# Patient Record
Sex: Female | Born: 1965 | Race: Black or African American | Hispanic: No | Marital: Married | Smoking: Current some day smoker
Health system: Southern US, Community
[De-identification: ages and names within clinical notes are randomized; demographics above are authoritative.]

## PROBLEM LIST (undated history)

## (undated) DIAGNOSIS — M549 Dorsalgia, unspecified: Secondary | ICD-10-CM

## (undated) DIAGNOSIS — Z780 Asymptomatic menopausal state: Secondary | ICD-10-CM

---

## 1999-02-11 ENCOUNTER — Inpatient Hospital Stay (HOSPITAL_COMMUNITY): Admission: AD | Admit: 1999-02-11 | Discharge: 1999-02-14 | Payer: Self-pay | Admitting: *Deleted

## 2000-03-19 ENCOUNTER — Other Ambulatory Visit: Admission: RE | Admit: 2000-03-19 | Discharge: 2000-03-19 | Payer: Self-pay | Admitting: *Deleted

## 2001-04-09 ENCOUNTER — Other Ambulatory Visit: Admission: RE | Admit: 2001-04-09 | Discharge: 2001-04-09 | Payer: Self-pay | Admitting: *Deleted

## 2002-02-25 ENCOUNTER — Encounter: Admission: RE | Admit: 2002-02-25 | Discharge: 2002-02-25 | Payer: Self-pay | Admitting: Internal Medicine

## 2002-02-25 ENCOUNTER — Encounter: Payer: Self-pay | Admitting: Internal Medicine

## 2002-05-02 ENCOUNTER — Other Ambulatory Visit: Admission: RE | Admit: 2002-05-02 | Discharge: 2002-05-02 | Payer: Self-pay | Admitting: *Deleted

## 2003-06-05 ENCOUNTER — Other Ambulatory Visit: Admission: RE | Admit: 2003-06-05 | Discharge: 2003-06-05 | Payer: Self-pay | Admitting: *Deleted

## 2004-07-20 ENCOUNTER — Other Ambulatory Visit: Admission: RE | Admit: 2004-07-20 | Discharge: 2004-07-20 | Payer: Self-pay | Admitting: Obstetrics and Gynecology

## 2004-12-30 ENCOUNTER — Other Ambulatory Visit: Admission: RE | Admit: 2004-12-30 | Discharge: 2004-12-30 | Payer: Self-pay | Admitting: Obstetrics and Gynecology

## 2005-03-09 ENCOUNTER — Encounter: Admission: RE | Admit: 2005-03-09 | Discharge: 2005-03-09 | Payer: Self-pay | Admitting: Obstetrics and Gynecology

## 2007-11-01 ENCOUNTER — Emergency Department (HOSPITAL_BASED_OUTPATIENT_CLINIC_OR_DEPARTMENT_OTHER): Admission: EM | Admit: 2007-11-01 | Discharge: 2007-11-01 | Payer: Self-pay | Admitting: Emergency Medicine

## 2010-03-10 ENCOUNTER — Encounter: Admission: RE | Admit: 2010-03-10 | Discharge: 2010-03-10 | Payer: Self-pay | Admitting: Obstetrics and Gynecology

## 2011-11-02 IMAGING — US US BREAST*L*
1 series · 7 of 7 positions shown · non-contrast
Comparison: Previous examinations, including the screening
mammogram dated 02/28/2010.

CLINICAL DATA: Possible left breast mass at recent screening
mammography.

DIGITAL DIAGNOSTIC LEFT MAMMOGRAM  AND LEFT BREAST ULTRASOUND:

[Series 1: us breast*left* · 7 of 7 slices shown]
[im 1/7]
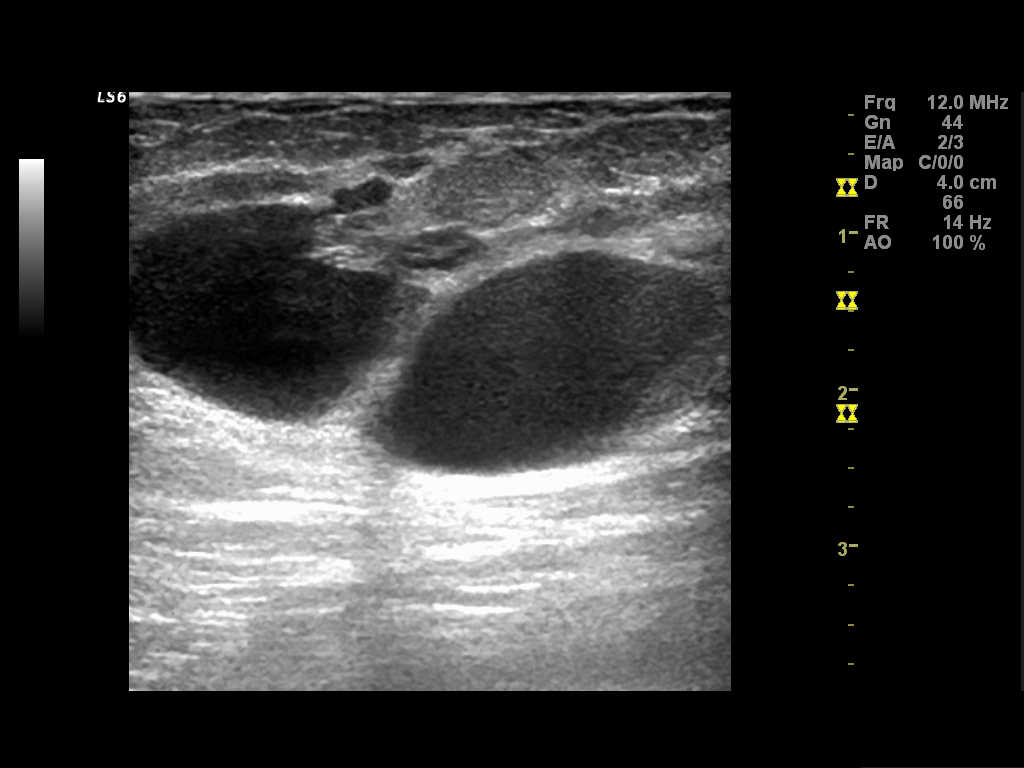
[im 2/7]
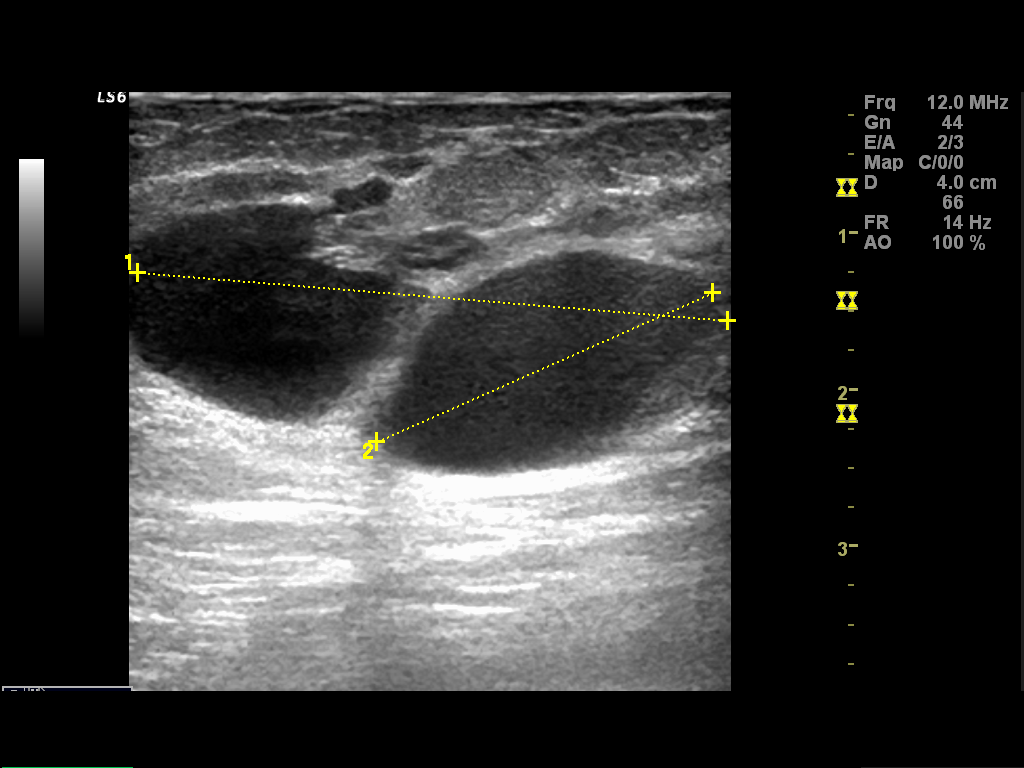
[im 3/7]
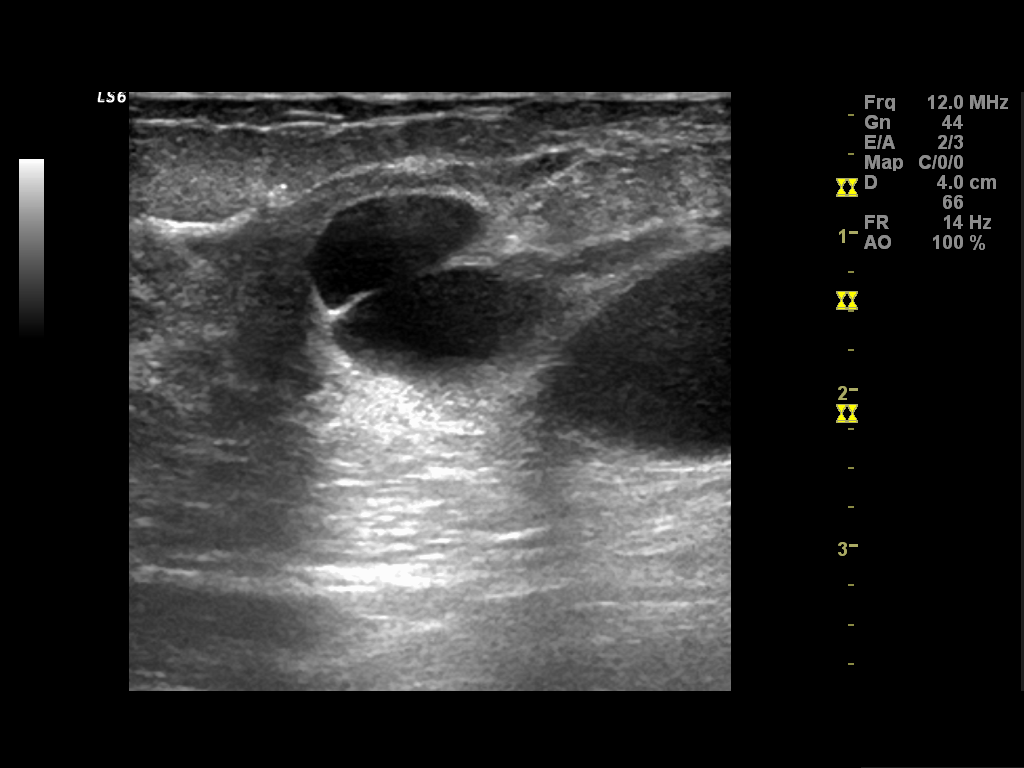
[im 4/7]
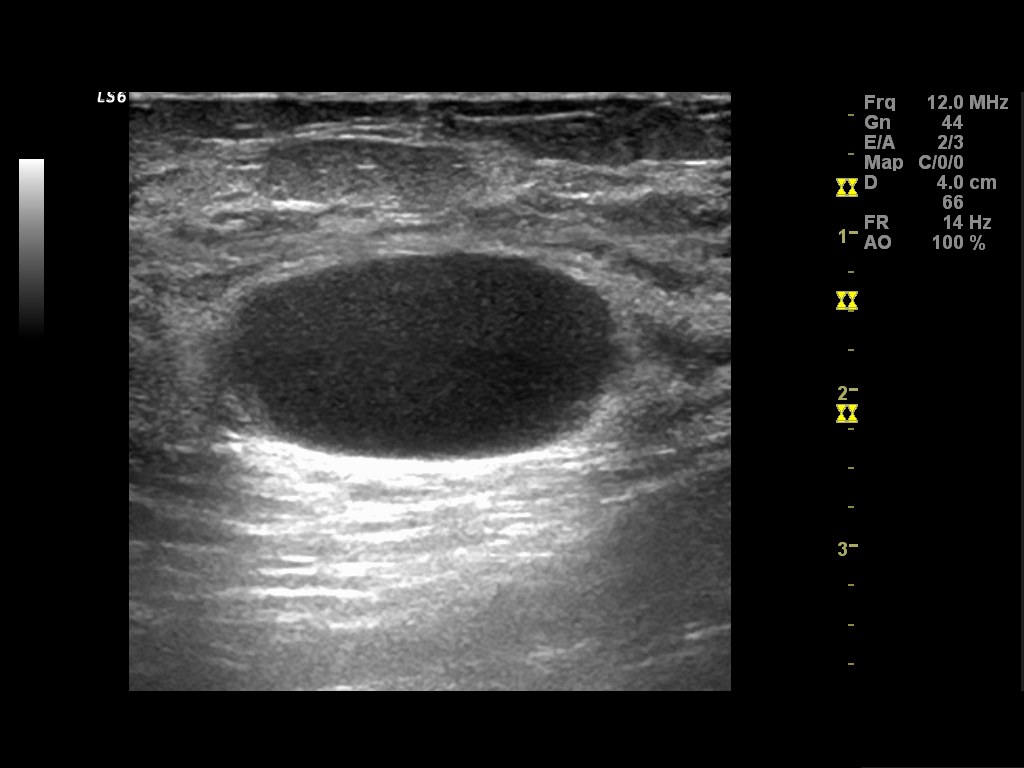
[im 5/7]
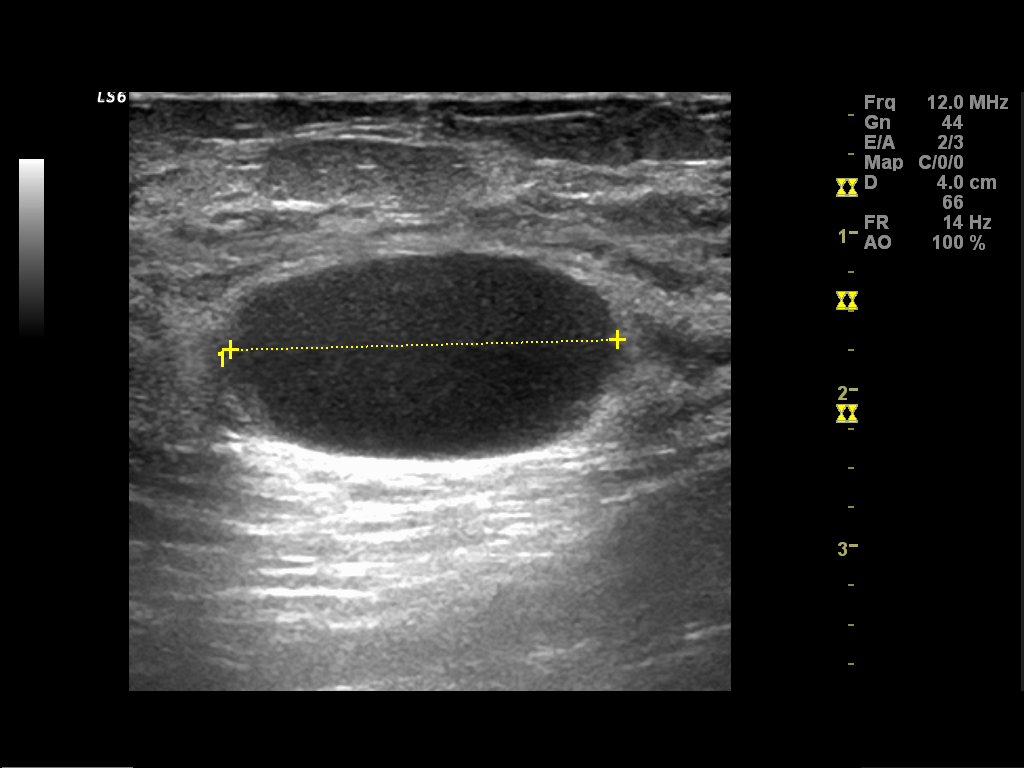
[im 6/7]
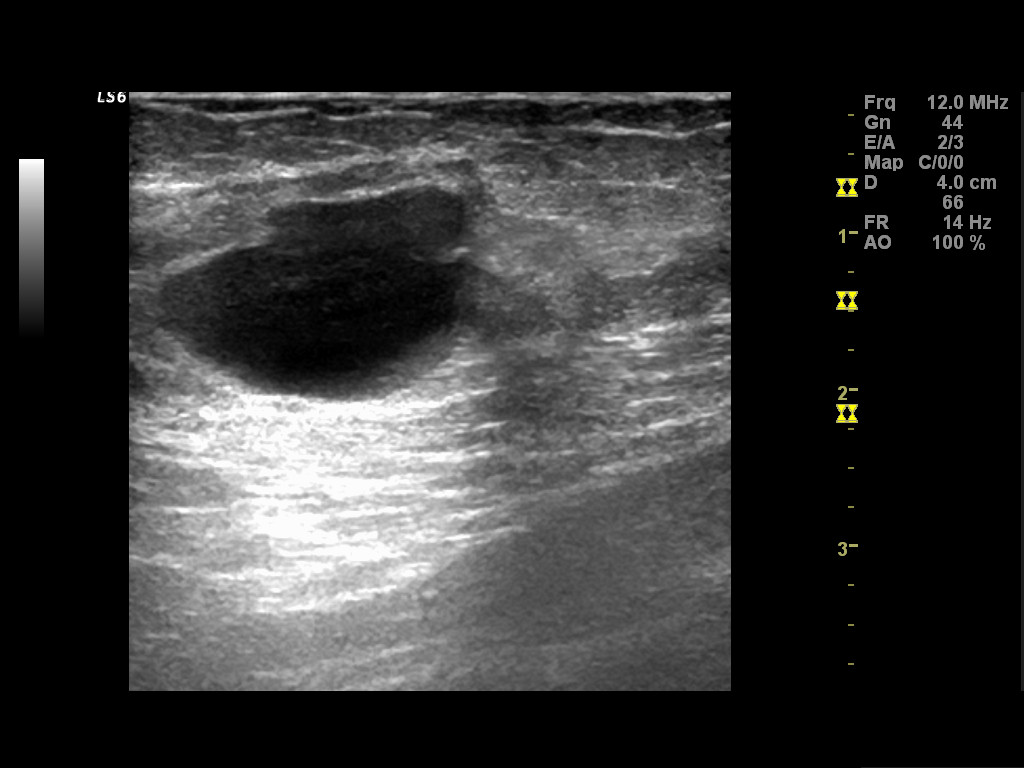
[im 7/7]
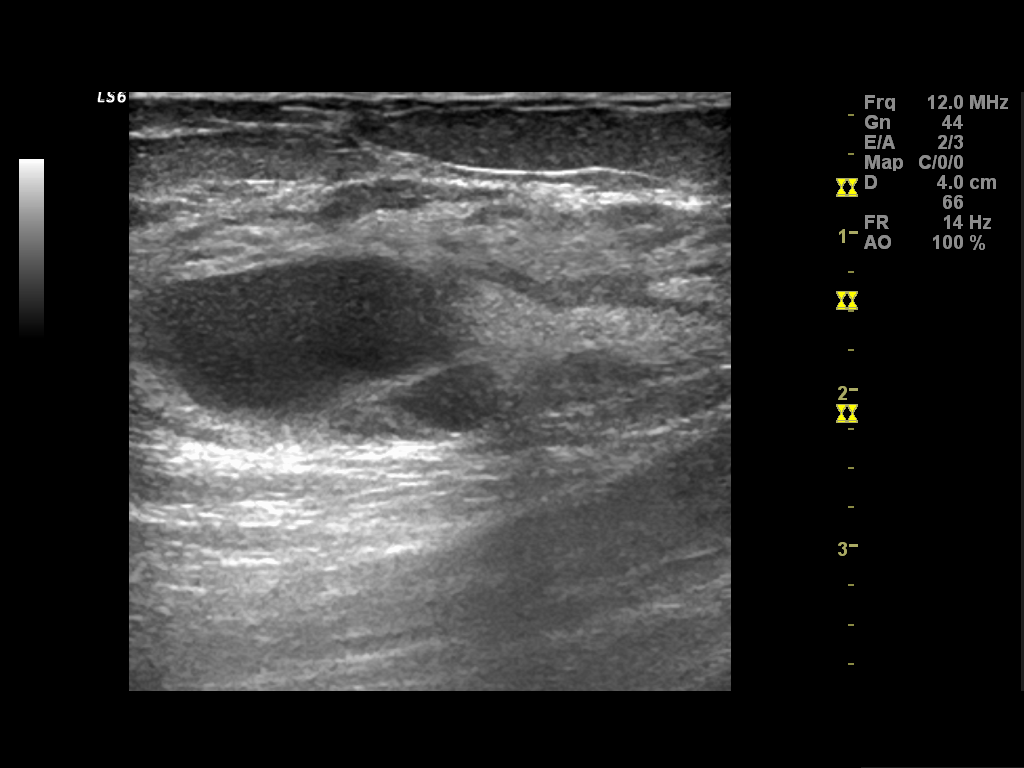

[7 of 7 positions shown; findings below may reference images not displayed]

FINDINGS: Spot compression views of the right breast confirm a
rounded, partially obscured mass in the upper outer left breast.
The visualized margins are smooth.

On physical exam, no mass is palpable in the upper outer left
breast.

Ultrasound is performed, showing a cluster of cysts in the 1:30
o'clock position of the left breast, 5 cm from the nipple.  The
largest cyst measures 2.5 cm in maximum diameter and the cluster
measures 3.8 cm in maximum diameter.  No solid masses or other
findings suspicious for malignancy seen.
IMPRESSION: Left breast cysts.  No evidence of malignancy.  Screening
mammography is recommended in 1 year.

BI-RADS CATEGORY 2:  Benign finding(s).

## 2017-04-02 ENCOUNTER — Emergency Department (HOSPITAL_BASED_OUTPATIENT_CLINIC_OR_DEPARTMENT_OTHER)
Admission: EM | Admit: 2017-04-02 | Discharge: 2017-04-02 | Disposition: A | Discharge status: Home / Self Care | Payer: BLUE CROSS/BLUE SHIELD | Attending: Emergency Medicine | Admitting: Emergency Medicine

## 2017-04-02 ENCOUNTER — Encounter (HOSPITAL_BASED_OUTPATIENT_CLINIC_OR_DEPARTMENT_OTHER): Payer: Self-pay | Admitting: *Deleted

## 2017-04-02 ENCOUNTER — Other Ambulatory Visit: Payer: Self-pay

## 2017-04-02 DIAGNOSIS — M533 Sacrococcygeal disorders, not elsewhere classified: Secondary | ICD-10-CM | POA: Insufficient documentation

## 2017-04-02 DIAGNOSIS — F17200 Nicotine dependence, unspecified, uncomplicated: Secondary | ICD-10-CM | POA: Insufficient documentation

## 2017-04-02 DIAGNOSIS — Z79899 Other long term (current) drug therapy: Secondary | ICD-10-CM | POA: Diagnosis not present

## 2017-04-02 DIAGNOSIS — R1031 Right lower quadrant pain: Secondary | ICD-10-CM | POA: Diagnosis present

## 2017-04-02 HISTORY — DX: Asymptomatic menopausal state: Z78.0

## 2017-04-02 LAB — URINALYSIS, MICROSCOPIC (REFLEX)

## 2017-04-02 LAB — URINALYSIS, ROUTINE W REFLEX MICROSCOPIC
Bilirubin Urine: NEGATIVE
Glucose, UA: NEGATIVE mg/dL
Ketones, ur: NEGATIVE mg/dL
Leukocytes, UA: NEGATIVE
Nitrite: NEGATIVE
Protein, ur: NEGATIVE mg/dL
Specific Gravity, Urine: <1.005 — ABNORMAL LOW (ref 1.005–1.030)
pH: 6.5 (ref 5.0–8.0)

## 2017-04-02 MED ORDER — KETOROLAC TROMETHAMINE 30 MG/ML IJ SOLN
INTRAMUSCULAR | Status: AC
  Filled 2017-04-02: qty 1

## 2017-04-02 MED ORDER — DEXAMETHASONE SODIUM PHOSPHATE 10 MG/ML IJ SOLN
INTRAMUSCULAR | Status: AC
  Filled 2017-04-02: qty 1

## 2017-04-02 MED ORDER — DEXAMETHASONE SODIUM PHOSPHATE 10 MG/ML IJ SOLN
10.0000 mg | Freq: Once | INTRAMUSCULAR | Status: AC
  Administered 2017-04-02: 10 mg via INTRAMUSCULAR

## 2017-04-02 MED ORDER — KETOROLAC TROMETHAMINE 30 MG/ML IJ SOLN
30.0000 mg | Freq: Once | INTRAMUSCULAR | Status: AC
  Administered 2017-04-02: 30 mg via INTRAMUSCULAR

## 2017-04-02 NOTE — ED Notes (Signed)
Pt was triaged by this RN. See note.

## 2017-04-02 NOTE — ED Provider Notes (Signed)
[facility] Provider Note: Erica DellJ. Lane Shadiamond Koska, MD, FACEP  CSN: [id number] MRN: [id number] ARRIVAL: 04/02/17 at 0204 ROOM: [facility]01/[facility]01   CHIEF COMPLAINT  Back Pain   HISTORY OF PRESENT ILLNESS  04/02/17 2:50 AM Erica Mayo is a 51 y.o. female recently placed on ciprofloxacin for 7 days for UTI (confirmed by culture) after having gross hematuria.  She was also having pain in her right SI joint radiating to her right groin. She was given injections of Toradol and dexamethasone which helped the pain but the pain returned yesterday.  She rates the pain as a 10 out of 10, worse with movement or palpation.  She had an ultrasound of her right kidney recently but has not yet gotten the results of that ultrasound. She is no longer having hematuria.  She has applied Icy Hot patches to her right SI joint and hip without relief.   Past Medical History:  Diagnosis Date  . Menopause     History reviewed. No pertinent surgical history.  No family history on file.  Social History   Tobacco Use  . Smoking status: Current Some Day Smoker  . Smokeless tobacco: Never Used  Substance Use Topics  . Alcohol use: No    Frequency: Never  . Drug use: No    Prior to Admission medications   Medication Sig Start Date End Date Taking? Authorizing Provider  ciprofloxacin (CIPRO) 500 MG tablet Take 500 mg by mouth 2 (two) times daily.   Yes [provider]  omeprazole (PRILOSEC) 40 MG capsule Take 40 mg by mouth daily.   Yes [provider]  predniSONE (DELTASONE) 10 MG tablet Take 10 mg by mouth daily with breakfast.   Yes [provider]    Allergies Patient has no known allergies.   REVIEW OF SYSTEMS  Negative except as noted here or in the History of Present Illness.   PHYSICAL EXAMINATION  Initial Vital Signs Blood pressure (!) 147/94, pulse 86, temperature 97.6 F (36.4 C), temperature source Oral, resp. rate 20, height 5\' 2"  (1.575 m), weight  75.8 kg (167 lb), SpO2 98 %.  Examination General: Well-developed, well-nourished female in no acute distress; appearance consistent with age of record HENT: normocephalic; atraumatic Eyes: Normal appearance Neck: supple Heart: regular rate and rhythm Lungs: clear to auscultation bilaterally Abdomen: soft; nondistended; nontender; bowel sounds present Back: Right SI joint tenderness GU: No CVA tenderness Extremities: No deformity; full range of motion Neurologic: Awake, alert and oriented; motor function intact in all extremities and symmetric; no facial droop Skin: Warm and dry Psychiatric: Circumstantial; pressured speech   RESULTS  Summary of this visit's results, reviewed by myself:   EKG Interpretation  Date/Time:    Ventricular Rate:    PR Interval:    QRS Duration:   QT Interval:    QTC Calculation:   R Axis:     Text Interpretation:        Laboratory Studies: Results for orders placed or performed during the hospital encounter of 04/02/17 (from the past 24 hour(s))  Urinalysis, Routine w reflex microscopic     Status: Abnormal   Collection Time: 04/02/17  2:30 AM  Result Value Ref Range   Color, Urine YELLOW YELLOW   APPearance CLEAR CLEAR   Specific Gravity, Urine <1.005 (L) 1.005 - 1.030   pH 6.5 5.0 - 8.0   Glucose, UA NEGATIVE NEGATIVE mg/dL   Hgb urine dipstick SMALL (A) NEGATIVE   Bilirubin Urine NEGATIVE NEGATIVE   Ketones,  ur NEGATIVE NEGATIVE mg/dL   Protein, ur NEGATIVE NEGATIVE mg/dL   Nitrite NEGATIVE NEGATIVE   Leukocytes, UA NEGATIVE NEGATIVE  Urinalysis, Microscopic (reflex)     Status: Abnormal   Collection Time: 04/02/17  2:30 AM  Result Value Ref Range   RBC / HPF 0-5 0 - 5 RBC/hpf   WBC, UA 0-5 0 - 5 WBC/hpf   Bacteria, UA RARE (A) NONE SEEN   Squamous Epithelial / LPF 0-5 (A) NONE SEEN   Imaging Studies: No results found.  ED COURSE  Nursing notes and initial vitals signs, including pulse oximetry, reviewed.  Vitals:    04/02/17 0236 04/02/17 0237  BP: (!) 147/94   Pulse: 86   Resp: 20   Temp: 97.6 F (36.4 C)   TempSrc: Oral   SpO2: 98%   Weight:  75.8 kg (167 lb)  Height:  5\' 2"  (1.575 m)   The patient is requesting injections of Toradol and dexamethasone.  She is declining prescription analgesics.  PROCEDURES    ED DIAGNOSES     ICD-10-CM   1. Sacroiliac joint pain M53.3        Erica Fraley, MD 04/02/17 [phone]

## 2017-04-02 NOTE — ED Notes (Signed)
ED Provider at bedside. 

## 2017-04-02 NOTE — ED Triage Notes (Signed)
Pt states she started noticing blood in her urine 2 weeks ago. Sent for culture. Placed on Cipro for 7 days thus far and Prednisone. Also c/o right side pain at that time. Given Toradol and Dexamethasone injection which helped the pain in her side and leg, but it has returned. Also had an U/S of kidney and is supposed to get those results tomorrow. Hurts to sit or lie down.

## 2017-06-27 ENCOUNTER — Other Ambulatory Visit: Payer: Self-pay

## 2017-06-27 ENCOUNTER — Emergency Department (HOSPITAL_BASED_OUTPATIENT_CLINIC_OR_DEPARTMENT_OTHER)
Admission: EM | Admit: 2017-06-27 | Discharge: 2017-06-27 | Disposition: A | Discharge status: Home / Self Care | Payer: BLUE CROSS/BLUE SHIELD | Attending: Emergency Medicine | Admitting: Emergency Medicine

## 2017-06-27 ENCOUNTER — Encounter (HOSPITAL_BASED_OUTPATIENT_CLINIC_OR_DEPARTMENT_OTHER): Payer: Self-pay | Admitting: *Deleted

## 2017-06-27 DIAGNOSIS — F17210 Nicotine dependence, cigarettes, uncomplicated: Secondary | ICD-10-CM | POA: Diagnosis not present

## 2017-06-27 DIAGNOSIS — Z79899 Other long term (current) drug therapy: Secondary | ICD-10-CM | POA: Insufficient documentation

## 2017-06-27 DIAGNOSIS — M5442 Lumbago with sciatica, left side: Secondary | ICD-10-CM | POA: Diagnosis not present

## 2017-06-27 DIAGNOSIS — M545 Low back pain: Secondary | ICD-10-CM | POA: Diagnosis present

## 2017-06-27 HISTORY — DX: Dorsalgia, unspecified: M54.9

## 2017-06-27 MED ORDER — DEXAMETHASONE SODIUM PHOSPHATE 10 MG/ML IJ SOLN
10.0000 mg | Freq: Once | INTRAMUSCULAR | Status: AC
  Administered 2017-06-27: 10 mg via INTRAMUSCULAR
  Filled 2017-06-27: qty 1

## 2017-06-27 MED ORDER — KETOROLAC TROMETHAMINE 30 MG/ML IJ SOLN
30.0000 mg | Freq: Once | INTRAMUSCULAR | Status: AC
  Administered 2017-06-27: 30 mg via INTRAMUSCULAR
  Filled 2017-06-27: qty 1

## 2017-06-27 NOTE — ED Triage Notes (Signed)
C/o back pain radiating to left leg  Unable to bear wt onset this am

## 2017-06-27 NOTE — ED Provider Notes (Addendum)
[facility] Provider Note: Lowella DellJ. Lane Sandeep Delagarza, MD, FACEP  CSN: [id number] MRN: [id number] ARRIVAL: 06/27/17 at 0404 ROOM: [facility]09/[facility]09   CHIEF COMPLAINT  Back Pain   HISTORY OF PRESENT ILLNESS  06/27/17 4:32 AM Erica Mayo is a 52 y.o. female with a history of back pain.  Her pain has traditionally been on the right side.  She was seen by myself in December of last year for right-sided back pain and she requested an injection of Toradol and dexamethasone as these had worked well for her in the past.  She has subsequently had visits to an orthopedist, and acupuncturist and a chiropractor for persistent pain in her right lower back.  She has had some improvement over that time.  She is here now with pain on the left side.  This began yesterday morning about 7:30 AM.  The pain begins in her left SI joint and radiates to her left groin.  There is associated pain and numbness of the left thigh.  She rates her pain as a 9 out of 10, worse with weightbearing.  She took some ibuprofen yesterday evening without relief.  She denies bladder or bowel changes.  She is requesting the same shots she got before.  She does not wish any opioids.   Past Medical History:  Diagnosis Date  . Back pain   . Menopause     History reviewed. No pertinent surgical history.  No family history on file.  Social History   Tobacco Use  . Smoking status: Current Some Day Smoker  . Smokeless tobacco: Never Used  Substance Use Topics  . Alcohol use: No    Frequency: Never  . Drug use: No    Prior to Admission medications   Medication Sig Start Date End Date Taking? Authorizing Provider  ciprofloxacin (CIPRO) 500 MG tablet Take 500 mg by mouth 2 (two) times daily.    [provider]  omeprazole (PRILOSEC) 40 MG capsule Take 40 mg by mouth daily.    [provider]    Allergies Patient has no known allergies.   REVIEW OF SYSTEMS  Negative except as noted here or in the  History of Present Illness.   PHYSICAL EXAMINATION  Initial Vital Signs Blood pressure 130/89, pulse 89, temperature 98.2 F (36.8 C), temperature source Oral, resp. rate 18, height 5\' 3"  (1.6 m), weight 76.2 kg (168 lb), SpO2 99 %.  Examination General: Well-developed, well-nourished female in no acute distress; appearance consistent with age of record HENT: normocephalic; atraumatic Eyes: Normal appearance Neck: supple Heart: regular rate and rhythm Lungs: clear to auscultation bilaterally Abdomen: soft; nondistended  Back: Left SI tenderness Extremities: No deformity; full range of motion except left hip Neurologic: Awake, alert and oriented; motor function intact in all extremities and symmetric; no facial droop; subjectively altered sensation in left thigh Skin: Warm and dry Psychiatric: Normal mood and affect   RESULTS  Summary of this visit's results, reviewed by myself:   EKG Interpretation  Date/Time:    Ventricular Rate:    PR Interval:    QRS Duration:   QT Interval:    QTC Calculation:   R Axis:     Text Interpretation:        Laboratory Studies: No results found for this or any previous visit (from the past 24 hour(s)). Imaging Studies: No results found.  ED COURSE  Nursing notes and initial vitals signs, including pulse oximetry, reviewed.  Vitals:   06/27/17 [id number] 06/27/17 [id number]  BP:  130/89  Pulse:  89  Resp:  18  Temp:  98.2 F (36.8 C)  TempSrc:  Oral  SpO2:  99%  Weight: 76.2 kg (168 lb)   Height: 5\' 3"  (1.6 m)    The patient was advised that an MRI is likely indicated at this point as she has had persistent pain on the right side which is now been joined by pain on the left side.  She was advised to follow-up with her orthopedist or her PCP for referral.  PROCEDURES    ED DIAGNOSES     ICD-10-CM   1. Acute left-sided low back pain with left-sided sciatica M54.42        Arjuna Doeden, Jonny Ruiz, MD 06/27/17 0450    Paula Libra,  MD 06/27/17 [phone]

## 2022-10-03 LAB — AMB RESULTS CONSOLE CBG: Glucose: 144

## 2022-10-03 LAB — HEMOGLOBIN A1C: Hemoglobin A1C: 5.9

## 2022-10-03 NOTE — Progress Notes (Signed)
Pt educated on prediabetes and lifestyle medicine

## 2023-10-25 NOTE — H&P (Unsigned)
 NAME: Reffitt, Gioia P MEDICAL RECORD NO: [id number] ACCOUNT NO: [id number] DATE OF BIRTH: May 09, 1965 PHYSICIAN: Charlie HERO. Johnnye, MD  History and Physical   DATE OF ADMISSION: 11/05/2023  PATIENT NAME:  Erica Mayo  DOB:  1966-01-19.  DATE OF SURGERY AT [facility]:  11/05/2023  CHIEF COMPLAINT:  Vulvar cyst.  HISTORY OF PRESENT ILLNESS:  A 58 year old G2 P1.  She has had a persistent probable sebaceous vulvar cyst that she requests excision.  This procedure including specific risks regarding bleeding, infection, adjacent organ injury along with her expected  recovery, all reviewed with her which she understands and accepts.  PAST MEDICAL HISTORY:   ALLERGIES:  PATIENT IS ALLERGIC TO SHELLFISH.  CURRENT MEDICATIONS:  Amlodipine 5 mg daily, clindamycin topical gel, epinephrine p.r.n., pantoprazole 20 mg daily, rosuvastatin 20 mg daily.  FAMILY HISTORY:  Significant for both mother and father with hypertension, diabetes, and dyslipidemia.  Her father had prostate cancer.  Mother had a CVA and arthritis in both father and mother.  SOCIAL HISTORY:  She is single.  She is a current someday smoker, quarter pack per day.  She is still employed.  PAST SURGICAL HISTORY:  She has had rotator cuff surgery, arthroscopy, and cryo for cervical dysplasia.  Prior Myriad genetic screening in 2018 was negative.  PHYSICAL EXAMINATION: VITAL SIGNS:  Blood pressure 108/78, temperature 98.2. HEENT:  Unremarkable. NECK:  Supple without masses.  Thyroid is nonpalpable. LUNGS:  Clear. CARDIOVASCULAR:  Heart clear. BREASTS:  Without masses, tenderness or nipple discharge. ABDOMEN:  Soft, flat, and nontender. PELVIC:  There is a 3 cm soft probable sebaceous cyst in the vulvar area, vagina, cervix bimanual, otherwise negative. EXTREMITIES:  Unremarkable. NEUROLOGIC:  Unremarkable.  IMPRESSION:  Vulvar sebaceous cyst.  PLAN:  Outpatient excision procedure and risks discussed as  above.    MUK D: 10/24/2023 11:09:16 am T: 10/25/2023 1:59:00 am  JOB: [id number]/ [id number]

## 2023-10-31 NOTE — Progress Notes (Signed)
 Per patient, she will be rescheduling her surgery that is scheduled for 7/7.  Left message for Megan at Dr. Frankey office.

## 2023-11-05 ENCOUNTER — Encounter: Admission: RE | Payer: Self-pay | Source: Home / Self Care

## 2023-11-05 ENCOUNTER — Ambulatory Visit (HOSPITAL_COMMUNITY): Admission: RE | Admit: 2023-11-05 | Payer: Self-pay | Source: Home / Self Care | Admitting: Obstetrics and Gynecology

## 2023-11-05 SURGERY — EXCISION, CYST, PERIURETHRAL
Anesthesia: Choice | Laterality: Right
